# Patient Record
Sex: Female | Born: 1975 | Race: White | Hispanic: No | Marital: Single | Smoking: Former smoker
Health system: Southern US, Community
[De-identification: ages and names within clinical notes are randomized; demographics above are authoritative.]

---

## 2003-11-07 ENCOUNTER — Other Ambulatory Visit: Admission: RE | Admit: 2003-11-07 | Discharge: 2003-11-07 | Payer: Self-pay | Admitting: Obstetrics and Gynecology

## 2004-01-31 ENCOUNTER — Ambulatory Visit (HOSPITAL_COMMUNITY): Admission: RE | Admit: 2004-01-31 | Discharge: 2004-01-31 | Payer: Self-pay | Admitting: Obstetrics and Gynecology

## 2004-05-22 ENCOUNTER — Encounter: Admission: RE | Admit: 2004-05-22 | Discharge: 2004-05-22 | Payer: Self-pay | Admitting: Obstetrics and Gynecology

## 2004-05-24 ENCOUNTER — Inpatient Hospital Stay (HOSPITAL_COMMUNITY): Admission: AD | Admit: 2004-05-24 | Discharge: 2004-05-26 | Payer: Self-pay | Admitting: Obstetrics and Gynecology

## 2004-05-29 ENCOUNTER — Encounter: Admission: RE | Admit: 2004-05-29 | Discharge: 2004-05-29 | Payer: Self-pay | Admitting: Obstetrics and Gynecology

## 2004-05-31 ENCOUNTER — Inpatient Hospital Stay (HOSPITAL_COMMUNITY): Admission: AD | Admit: 2004-05-31 | Discharge: 2004-06-04 | Payer: Self-pay | Admitting: Obstetrics and Gynecology

## 2004-06-01 ENCOUNTER — Encounter (INDEPENDENT_AMBULATORY_CARE_PROVIDER_SITE_OTHER): Payer: Self-pay | Admitting: Specialist

## 2004-07-16 ENCOUNTER — Other Ambulatory Visit: Admission: RE | Admit: 2004-07-16 | Discharge: 2004-07-16 | Payer: Self-pay | Admitting: Obstetrics and Gynecology

## 2006-03-04 IMAGING — US US OB UMBILICAL ART DOPPLER
1 series · 13 of 18 positions shown · non-contrast
Comparison: none

CLINICAL DATA: Assess growth.  Pregnancy-induced hypertension.

[Series 1: unknown · 0.24mm/px · 13 of 18 slices shown]
[im 1/18]
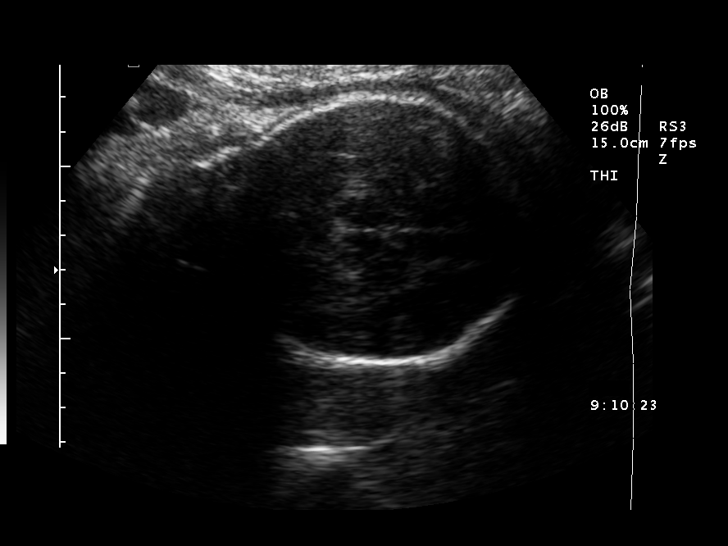
[im 3/18]
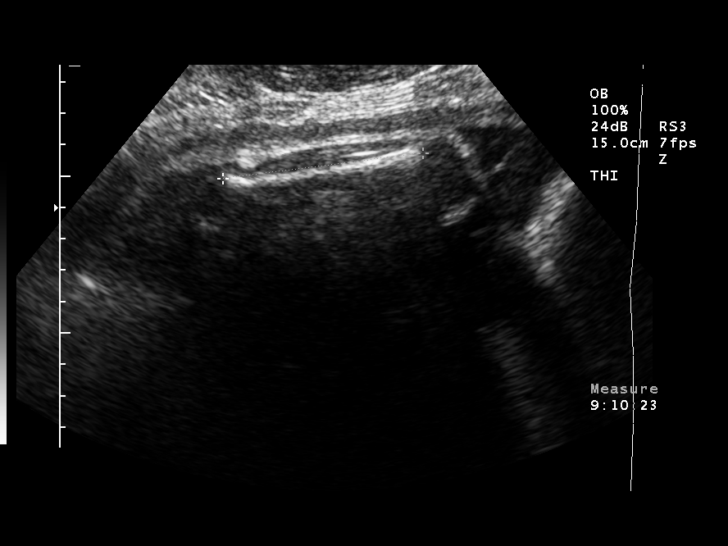
[im 4/18]
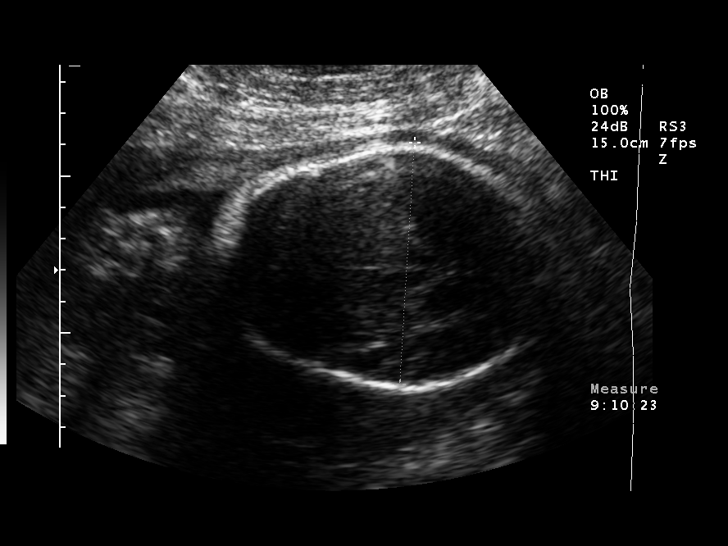
[im 5/18]
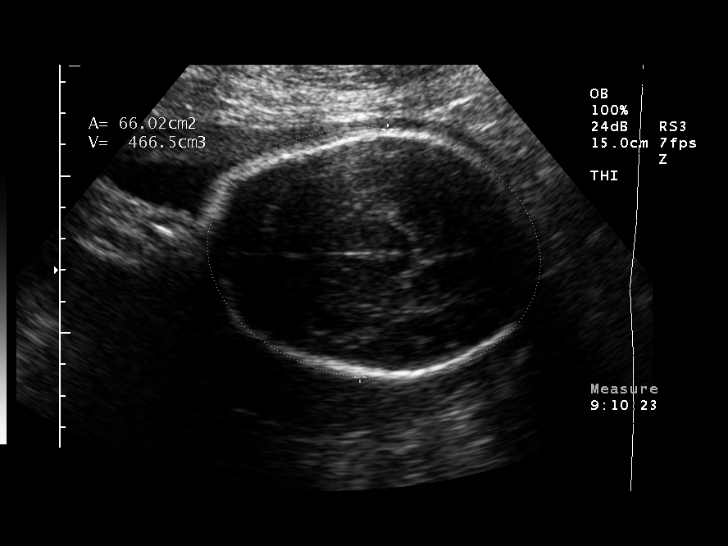
[im 7/18]
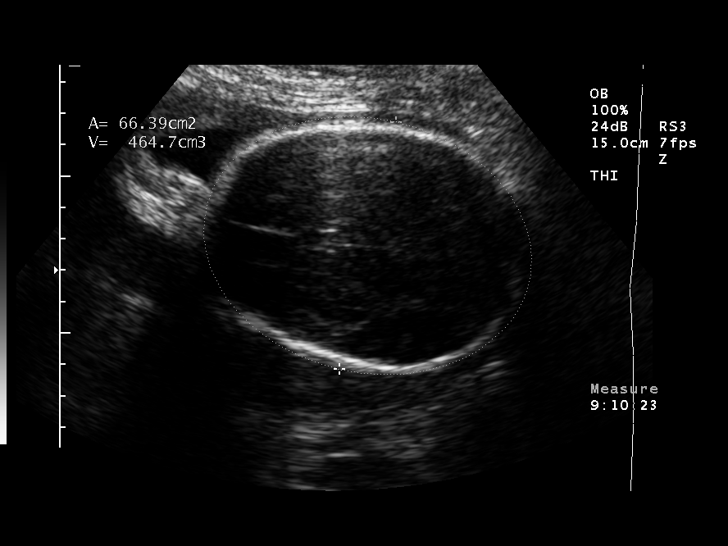
[im 8/18]
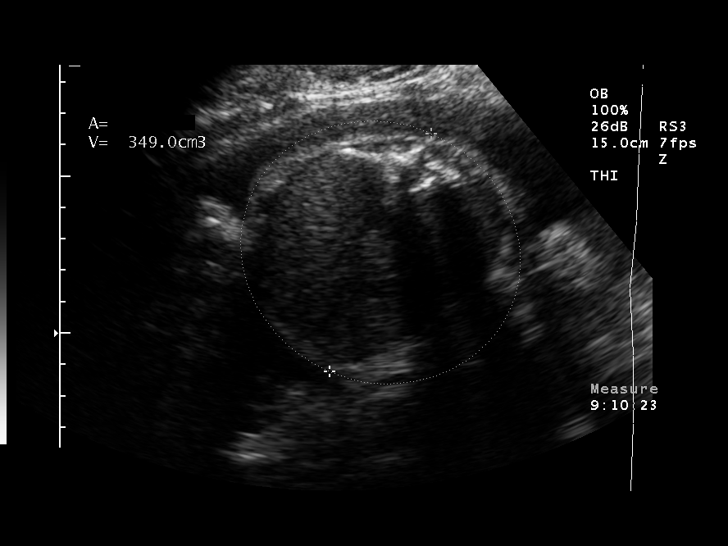
[im 10/18]
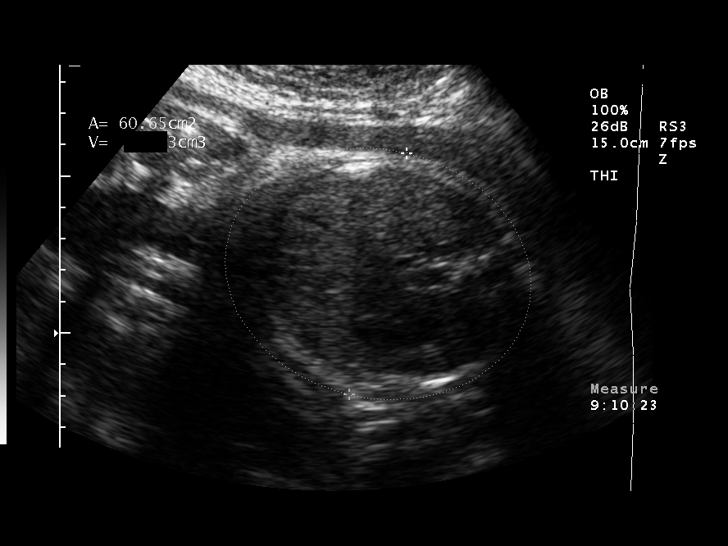
[im 11/18]
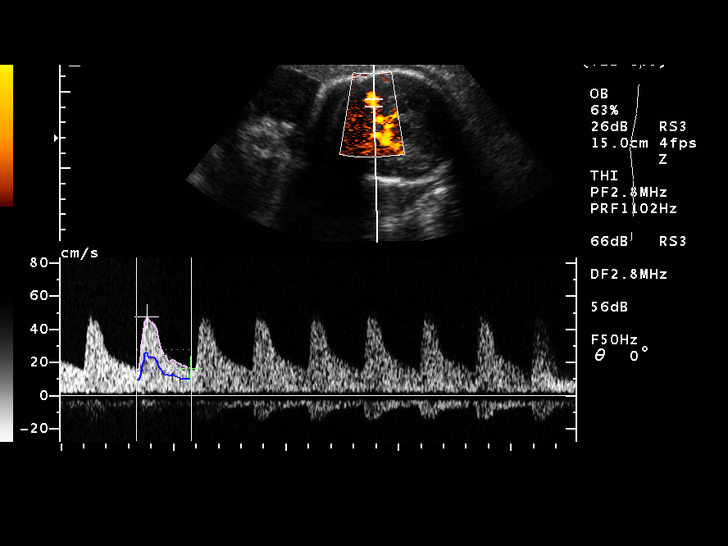
[im 12/18]
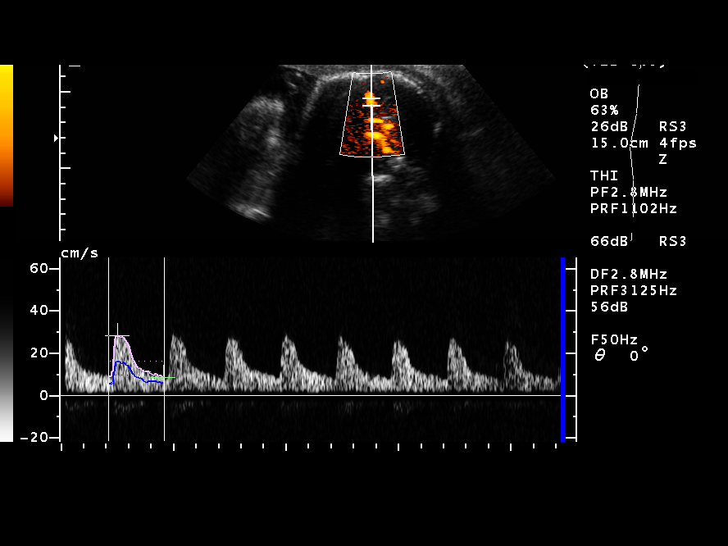
[im 14/18]
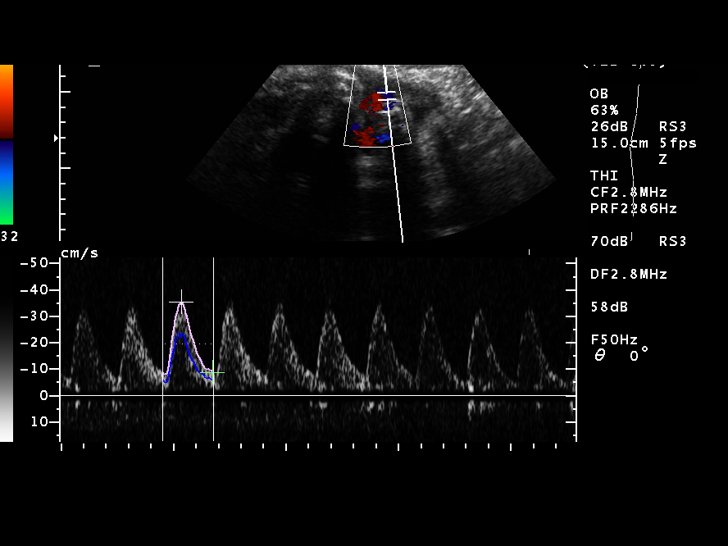
[im 15/18]
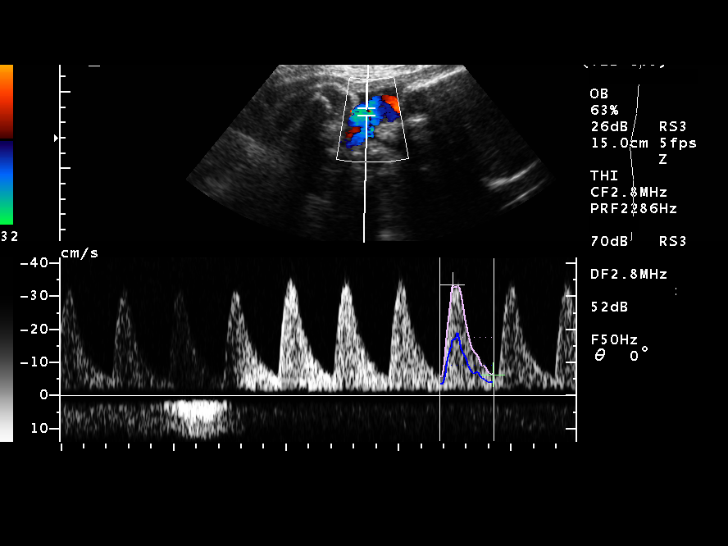
[im 16/18]
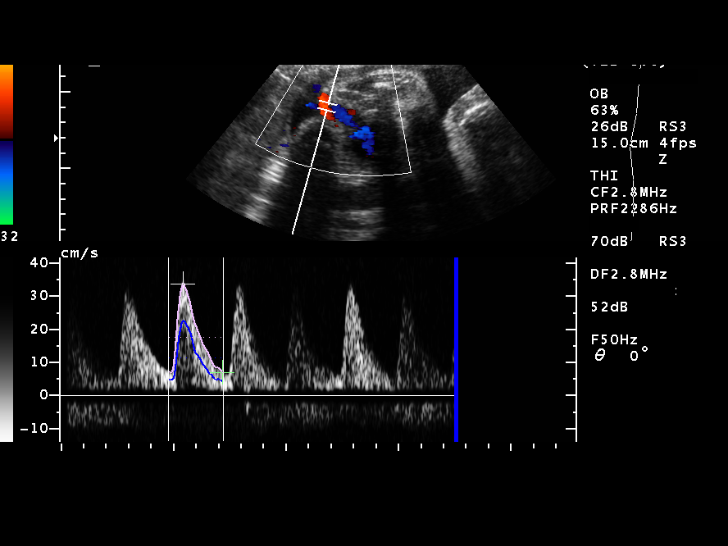
[im 18/18]
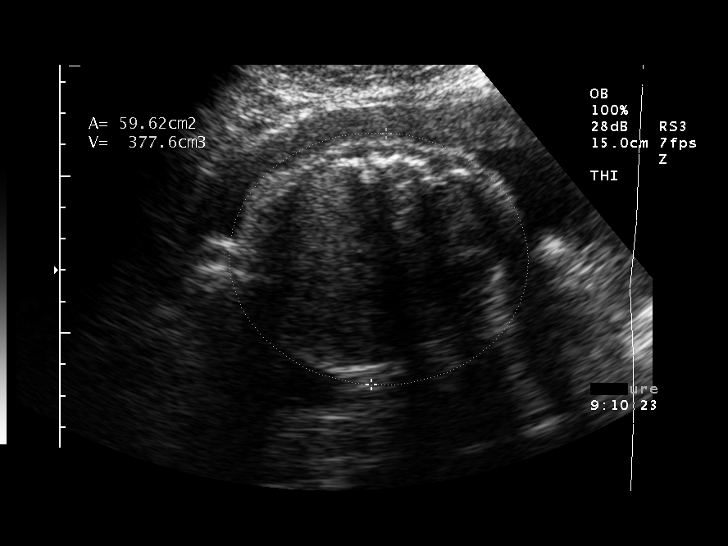

[13 of 18 positions shown; findings below may reference images not displayed]

OBSTETRICAL ULTRASOUND RE-EVALUATION WITH FETAL DOPPLER EXAM, 05/24/04
Number of Fetuses:  1
Heart Rate:  137 BPM
Movement:  Yes
Breathing:  No
Presentation:  Vertex
Placental Location:  Posterior
Grade:  I
Previa:  No
Amniotic Fluid (subjective):  Normal

FETAL BIOMETRY
BPD:  7.7 cm, 31 W 0 D
HC:  29.4 cm, 32 W 4 D
AC:  27.7 cm, 31 W 6 D
FL:  6.5 cm, 33 W 4 D

Mean GA:  32 W 2 D
Assigned GA:  35 W 3 D

EFW:  8063 grams (H) 63th-58th %ile (8086 ? 6865 g) for 35 weeks 
BPD/OFD  .75 (0.70 ? 0.86)
FL/BPD  .85 (0.71 ? 0.87)
FL/AC  .24 (0.20 - 0.24)    
HC/AC  1.06 (0.93 ? 1.11)
DOPPLER ULTRASOUND OF FETUS

Umbilical Artery S/D Ratio: 4.95 (NL< 3.45)

Middle Cerebral Artery PI:  1.13 (NL> 1.29)
IMPRESSION: Single living intrauterine pregnancy demonstrating an estimated gestational age by ultrasound of 32 weeks and 2 days.  This is 3 weeks and 1 day behind assigned gestational age by initial ultrasound of 35 weeks and 3 days.  Currently, the estimated fetal weight is just above the 10th percentile for a 35 week gestation and is concerning for developing symmetric intrauterine growth restriction or a small for gestational age fetus.
Subjectively and quantitatively normal amniotic fluid volume.
Elevated umbilical artery systolic/diastolic ratio and a decreased middle cerebral artery pulsatility index is noted.  No absence or reversal of end diastolic flow is seen on any of the interrogated strips.

## 2006-03-04 IMAGING — US US FETAL BPP W/O NONSTRESS
1 series · 14 of 27 positions shown · non-contrast
Comparison: none

CLINICAL DATA: Assess fetal well-being.

[Series 1: unknown · 0.30mm/px · 14 of 27 slices shown]
[im 1/27]
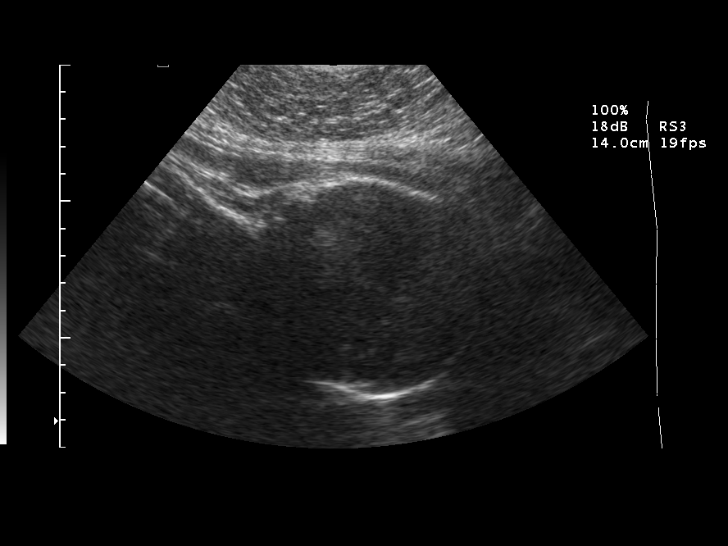
[im 3/27]
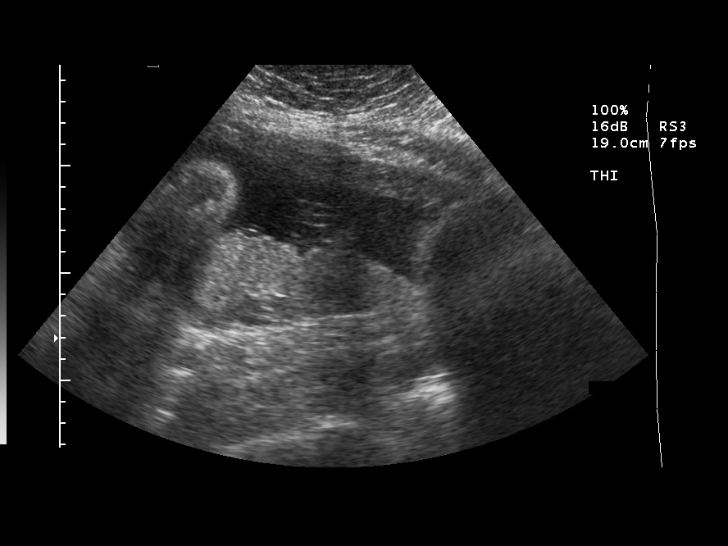
[im 5/27]
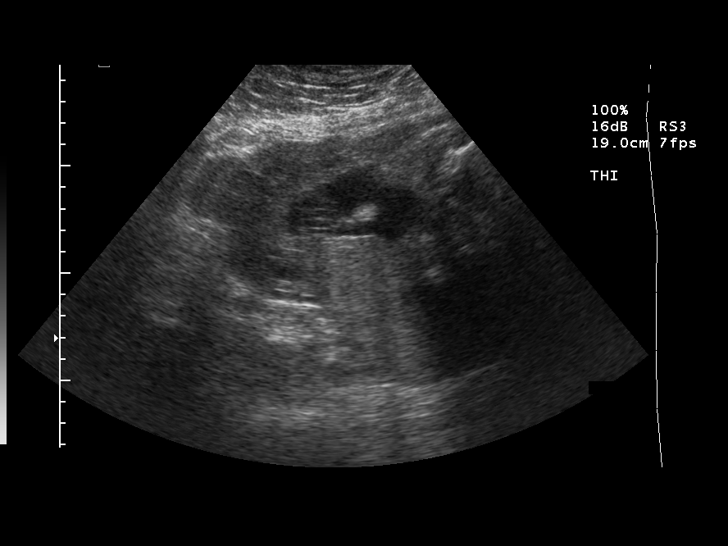
[im 7/27]
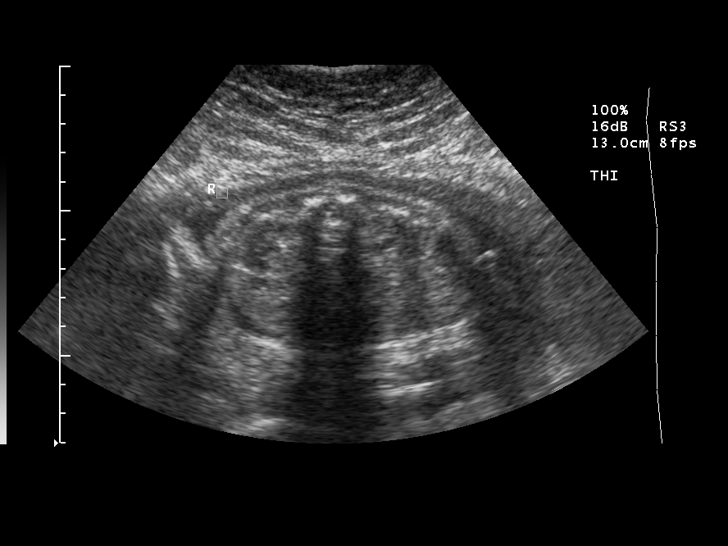
[im 9/27]
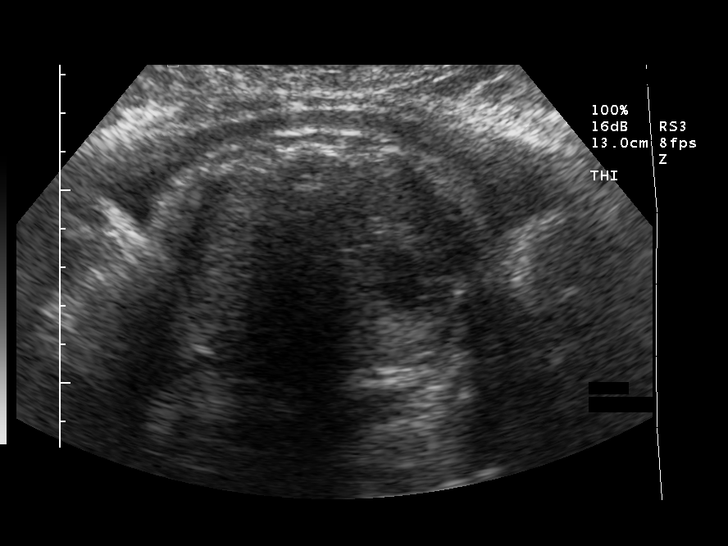
[im 11/27]
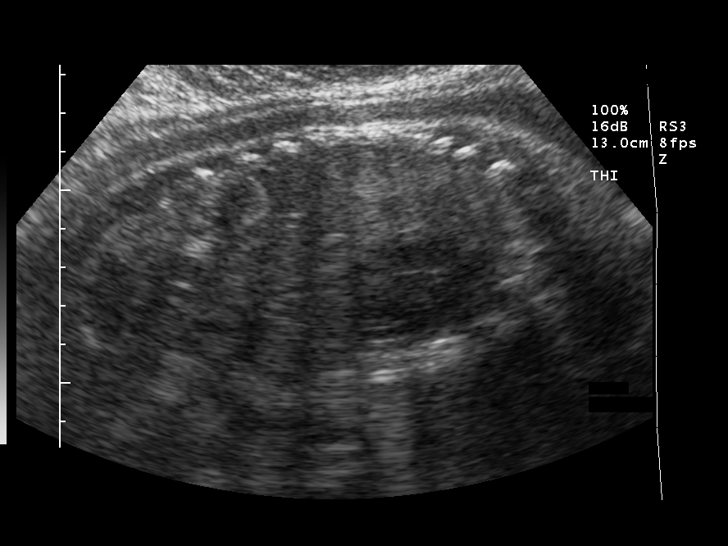
[im 13/27]
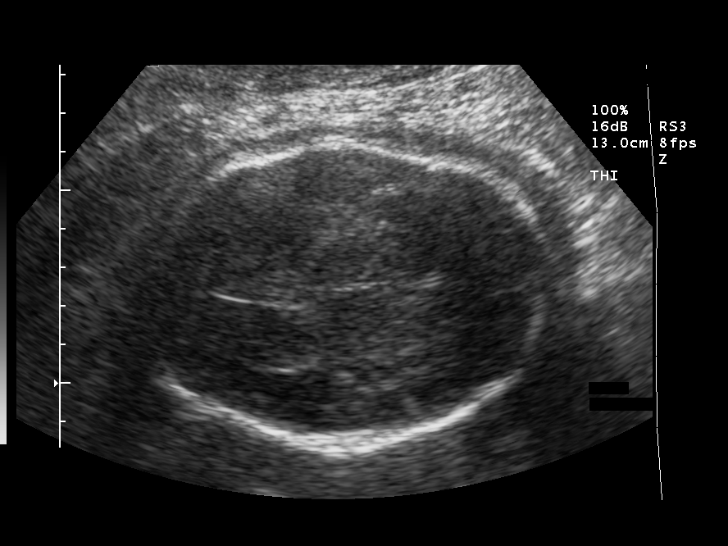
[im 15/27]
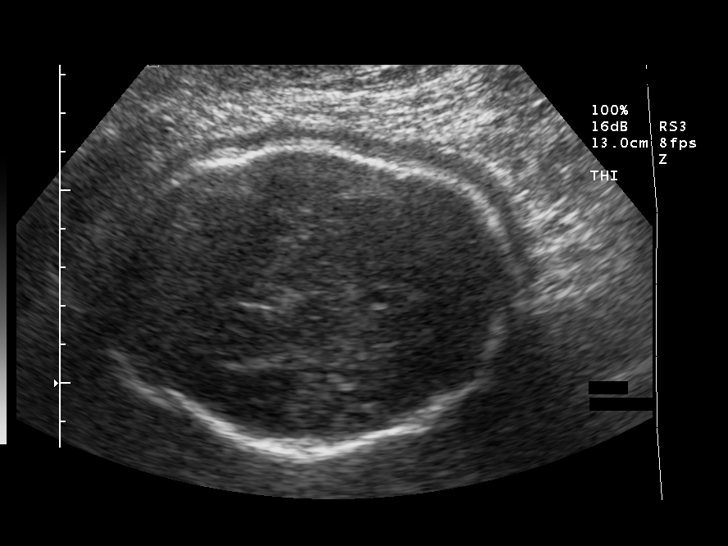
[im 17/27]
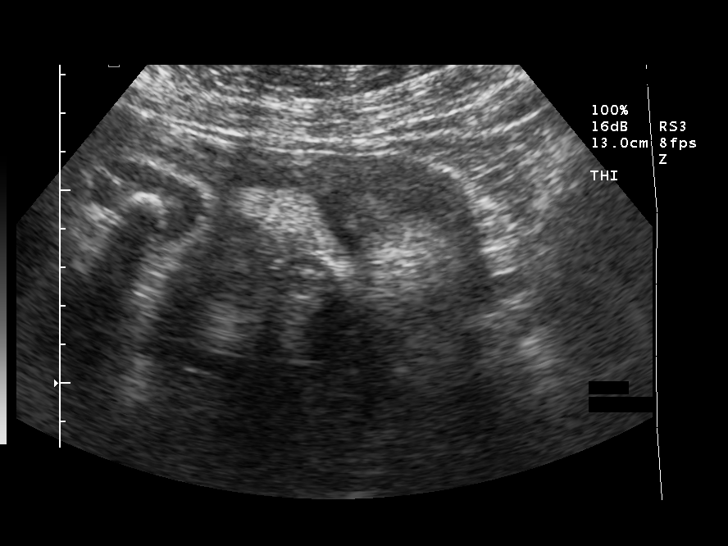
[im 19/27]
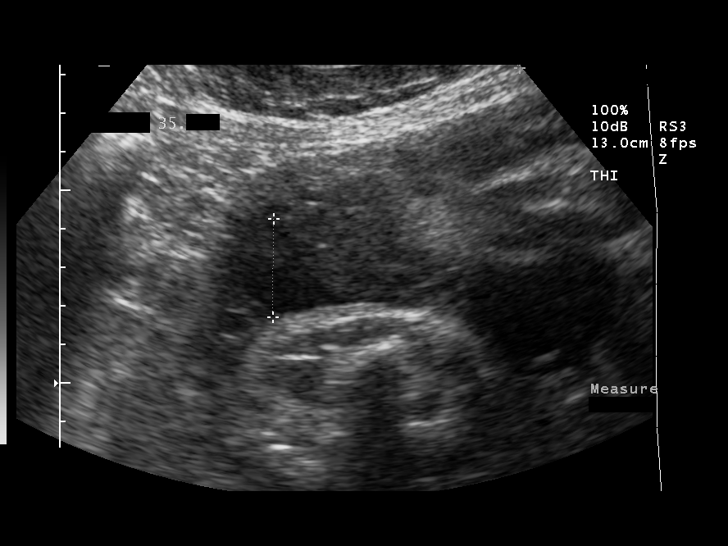
[im 21/27]
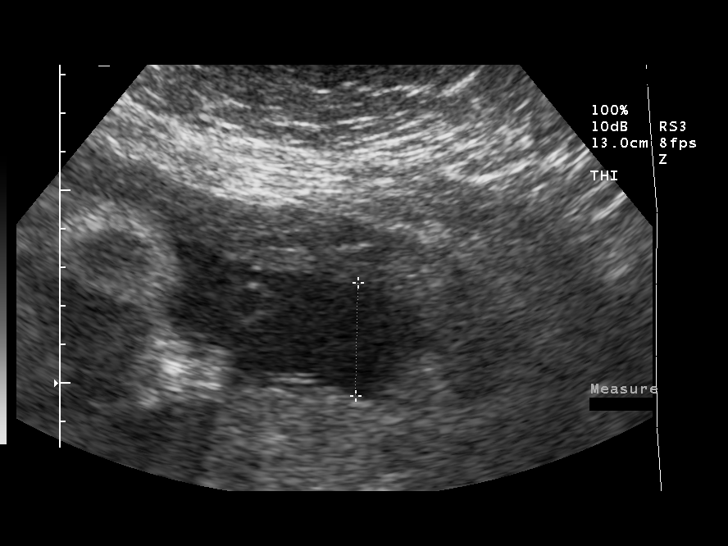
[im 23/27]
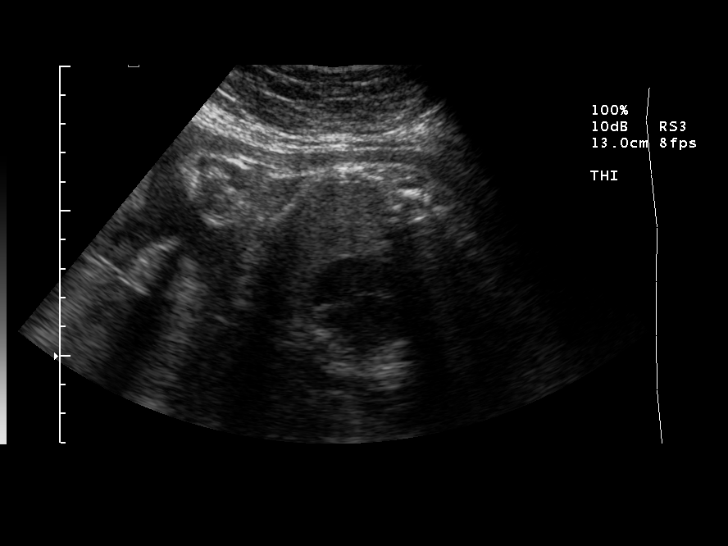
[im 25/27]
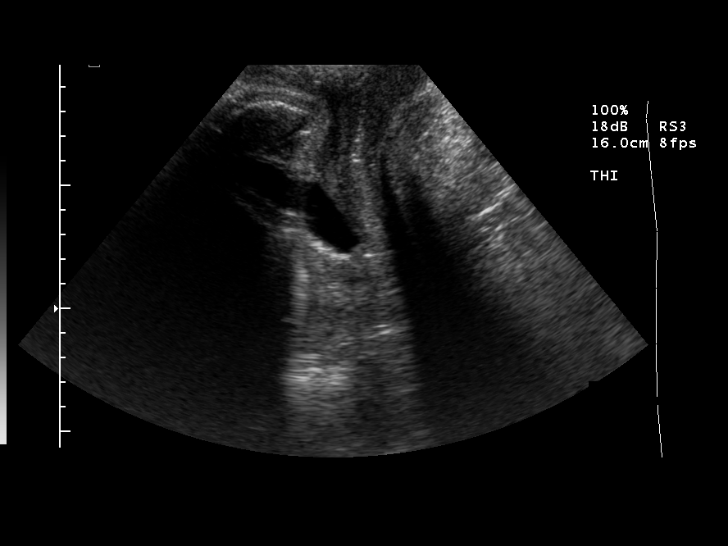
[im 27/27]
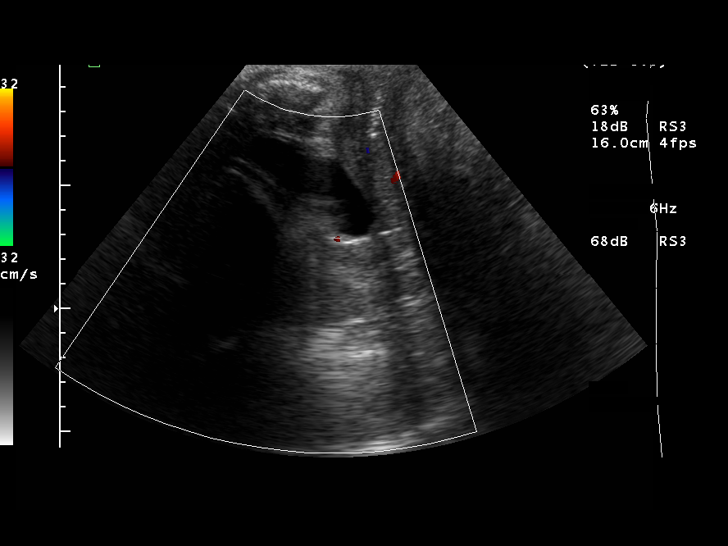

[14 of 27 positions shown; findings below may reference images not displayed]

BIOPHYSICAL PROFILE

Number of Fetuses:  1
Heart rate:  133 BPM
Movement:  Yes
Breathing:  Yes
Presentation:  Cephalic
Placental Location:  Posterior
Grade:  I
Previa:  No
Amniotic Fluid (Subjective):  Normal
Amniotic Fluid (Objective):  AFI 12.7 cm (4th-64th %ile =  7.9 to 24.9 cm for 35 weeks) 

Fetal measurements and complete anatomic evaluation were not requested.  The following fetal anatomy was visualized on this exam:  Lateral ventricles, thalami, stomach, kidneys, bladder, and diaphragm.

BPP SCORING
Movements:  2  Time:  15 minutes
Breathing:  2
Tone:  2
Amniotic Fluid:  2
Total Score:  8

MATERNAL FINDINGS:
Cervix:  3.6 cm translabially.
IMPRESSION: Biophysical profile score [DATE].  
Subjectively and quantitatively normal amniotic fluid and normal cervical length.
Interval resolution of prior placenta previa.
No late developing fetal anatomic abnormalities are identified associated with the lateral ventricles, stomach, kidneys, or bladder.  A four-chamber heart view could not be well assessed due to fetal positioning combined with maternal habitus.

## 2011-03-08 NOTE — Discharge Summary (Signed)
Valerie Kirby, FERRAN                  ACCOUNT NO.:  [id number]   MEDICAL RECORD NO.:  [id number]          PATIENT TYPE:  INP   LOCATION:  9147                          FACILITY:  [facility]   PHYSICIAN:  James A. Ashley Royalty, M.D.DATE OF BIRTH:  Sep 15, 1976   DATE OF ADMISSION:  05/31/2004  DATE OF DISCHARGE:  06/04/2004                                 DISCHARGE SUMMARY   DISCHARGE DIAGNOSES:  1.  Intrauterine pregnancy at 36 weeks, 3 day gestation.  2.  Pregnancy-induced hypertension.  3.  Intrauterine growth restriction (borderline).  4.  Oligohydramnios.  5.  Fetal heart changes consistent with diminished uteroplacental reserve.  6.  Desire for attempt at permanent surgical sterilization.   OPERATIONS/SPECIAL PROCEDURES:  1.  Primary low transverse cesarean section.  2.  Bilateral tubal sterilization procedure (modified Pomeroy).   CONSULTATIONS:  None.   DISCHARGE MEDICATIONS:  Percocet.   HISTORY AND PHYSICAL:  This is a 35 year old gravida 2, para 1, EDC  June 25, 2004, 36 weeks, 3 days gestation.  Prenatal care was  complicated by smoking, transient placenta previa (resolved as of 05/03/04)  and recent diagnosis of intrauterine growth restriction noted during recent  hospitalization for blood pressure elevation one week prior to current  admission.  Since admission, the patient has been maintained on aggressive  fetal surveillance.  Biophysical profile on the day of admission revealed a  score of 8 over a possible 8; however, the amniotic fluid index was noted to  be 7.7, sixth percentile for 36 weeks.  The patient was hence admitted for  further evaluation and therapy and consideration for delivery.  For the  remainder of the history and physical, please see chart.   HOSPITAL COURSE:  The patient was admitted to [facility] of  [place].  Admission laboratory studies were drawn.  Observation of the  fetal heart rate in the hospital revealed a generally reassuring  pattern  with occasional decelerations in association with contractions which  resembled late decelerations.  As the cervix continued to be quite  unfavorable, the determination was made that the patient probably  demonstrated diminished uteroplacental reserve and would thus be managed by  proceeding with cesarean section, hence was taken to the operating room  06/01/04 and underwent primary low transverse cesarean section as well as  bilateral tubal sterile sterilization procedure (modified Pomeroy).  The  procedure was uncomplicated.  It yield a 3 pound, 15 ounce female, Apgars of 8  at one minute, 9 at five minutes, sent to the newborn nursery.  Arterial  cord pH was 7.21.  Postoperative course was benign.  She was discharged home  on 06/04/04, afebrile and in satisfactory condition.   ACCESSORY CLINICAL FINDINGS:  Hemoglobin and hematocrit on admission were  13.4 and 40.1 respectively.  Additional values were obtained during the  hospitalization the last of which was June 03, 2004 which revealed values  of 11.2 and 33.2 respectively.  PIH panel was essentially benign.  Type and  Rh revealed a B positive blood.   DISPOSITION:  The patient is to return to [facility] and  Obstetrics  in 4-6 weeks for postpartum evaluation.  In addition, she was to return in  approximately 2 day to check blood pressure as well.      JAM/MEDQ  D:  07/18/2004  T:  07/18/2004  Job:  [id number]

## 2011-03-08 NOTE — H&P (Signed)
Valerie Kirby, Valerie Kirby                            ACCOUNT NO.:  [id number]   MEDICAL RECORD NO.:  [id number]                   PATIENT TYPE:  OUT   LOCATION:  ULT                                  [facility]   PHYSICIAN:  James A. Ashley Royalty, M.D.             DATE OF BIRTH:  02/26/1976   DATE OF ADMISSION:  05/24/2004  DATE OF DISCHARGE:                                HISTORY & PHYSICAL   This is a 35 year old, gravida 2, para 1, LMP September 05, 2003, [facility]  June 25, 2004.  Prenatal care has been complicated by a transient  placenta previa which was documented as resolved May 03, 2004.  The patient  also is a smoker albeit only modestly.  She presented to the office today at  which time she had a blood pressure of approximately 134/100.  She denies  any epigastric or right upper quadrant pain, headaches, or visual  disturbances.  She is admitted for evaluation and treatment of hypertension  in the context of pregnancy.   MEDICATIONS:  Vitamins.   PAST MEDICAL HISTORY:  Medical negative. Surgical negative. Allergies  PENICILLIN.   SOCIAL HISTORY:  The patient is a smoker, albeit modestly, denies use of  alcohol.   REVIEW OF SYMPTOMS:  Noncontributory.   PHYSICAL EXAMINATION:  GENERAL:  Well-developed, well-nourished, pleasant  white female in no acute distress.  VITAL SIGNS:  Afebrile, vital signs stable.  SKIN:  Warm and dry without lesions.  LYMPH:  There is supraclavicular, cervical or inguinal adenopathy.  HEENT:  Normocephalic, atraumatic.  NECK:  Supple without thyromegaly.  CHEST:  Lungs are clear.  CARDIAC:  Regular rate and rhythm without murmurs, rubs or gallops.  BREASTS:  Deferred.  ABDOMEN:  Gravid with a fundal height of approximately 35 cm.  Fetal heart  tones were auscultated with the Doppler.  MUSCULOSKELETAL:  Reveals full range of motion with 2+ edema dependently.  DTR's are 1 to 2+ and equal bilaterally.  PELVIC:  External genitalia within normal  limits.  Bimanual examination  reveals the cervix to be closed, soft and presenting part high.   IMPRESSION:  1. Intrauterine pregnancy at 35 weeks and 3 days gestation.  2. Hypertension--probable pregnancy induced. Mild.  3. Smoker.   PLAN:  Admit. Check labs including ultrasound, obtain 24 hour urine.  Will  assess blood pressures frequently and treat accordingly.                                               James A. Ashley Royalty, M.D.    JAM/MEDQ  D:  05/24/2004  T:  05/24/2004  Job:  [id number]

## 2011-03-08 NOTE — Op Note (Signed)
Valerie Kirby, Valerie Kirby                            ACCOUNT NO.:  [id number]   MEDICAL RECORD NO.:  [id number]                   PATIENT TYPE:  OUT   LOCATION:  ULT                                  [facility]   PHYSICIAN:  James A. Ashley Royalty, M.D.             DATE OF BIRTH:  1975/11/09   DATE OF PROCEDURE:  06/01/2004  DATE OF DISCHARGE:                                 OPERATIVE REPORT   PREOPERATIVE DIAGNOSES:  1. Intrauterine pregnancy at [redacted] weeks gestation.  2. Preeclampsia.  3. Intrauterine growth restriction.  4. Oligohydramnios.  5. Fetal heart rate changes consistent with diminished uteroplacental     reserve.  6. Desire for attempted permanent surgical sterilization.   POSTOPERATIVE DIAGNOSES:  1. Intrauterine pregnancy at [redacted] weeks gestation.  2. Preeclampsia.  3. Intrauterine growth restriction.  4. Oligohydramnios.  5. Fetal heart rate changes consistent with diminished uteroplacental     reserve.  6. Desire for attempted permanent surgical sterilization.   PATHOLOGY:  Pending.   PROCEDURE:  1. Primary low transverse cesarean section.  2. Bilateral tubal sterilization procedure (modified Pomeroy).   SURGEON:  Rudy Jew. Ashley Royalty, M.D.   ASSISTANT:  Bing Neighbors. Delcambre, MD   ANESTHESIA:  Spinal.   FINDINGS:  3 pound 15 ounce female, Apgar's 8 at 1 minute, 9 at 5 minutes sent  to the newborn nursery. Arterial cord pH was 7.21.   ESTIMATED BLOOD LOSS:  800 mL.   COMPLICATIONS:  None.   PACKS/DRAINS:  Foley.   COUNTS:  Sponge, needle and instrument counts were reported as correct x2.   DESCRIPTION OF PROCEDURE:  The patient was taken to the operating room and  placed in the sitting position. After a spinal anesthetic was administered,  she was placed in the dorsal supine position and prepped and draped in the  usual manner for abdominal surgery. A Foley catheter was placed. A  Pfannenstiel incision was made down to the level of the fascia. The fascia  was  nicked with a knife and incised transversely with Mayo scissors. The  underlying rectus muscles were separated from overlying fascia using sharp  and blunt dissection.  The peritoneum was elevated with hemostats and  entered atraumatically with Metzenbaum scissors. The incision was extended  longitudinally. The uterus was identified and a bladder flap created by  incising the anterior uterine serosa and sharply and bluntly dissecting the  bladder inferiorly. It was held in place with a bladder blade.  The uterus  was then entered through a low transverse incision using sharp and blunt  dissection. Amniotomy was performed. There was only a scant amount of fluid  present.  It was clear. The infant was then delivered from the vertex  presentation in an atraumatic manner. The infant was suctioned. The cord was  strictly clamped, cut and the infant given immediately to the awaiting  pediatric's team. Arterial cord pH was obtained  from an isolated segment.  Next regular cord blood was obtained.  The placenta and membranes were  removed in their entirety and submitted to pathology for histologic studies.  The uterus was then exteriorized. The uterus was closed in two running  layers with #1 Vicryl. The first was a running locking layer. The second was  a running, intermittently locking, and imbricating layer. Hemostasis was  noted.   Attention was then turned to the tubal sterilization procedure. The left  lobe and tube was grasped with a Babcock clamp and traced to its fimbriated  end. An avascular area in the distal isthmic to proximal ampullary portion  was chosen for ligation.  Two #1 plain catgut sutures were used as  ligatures, tied securely around this portion of the tube. The intervening  portion of fallopian tube was incised with Metzenbaum scissors and submitted  to pathology for histologic studies. Hemostasis was noted. Next, the right  fallopian tube was grasped with a Babcock clamp  and traced to its fimbriated  end. An avascular area in the distal isthmic to proximal ampullary portion  was chosen for ligation. Two #1 plain catgut sutures were securely tied  around this knuckle of the tube. The intervening portion of the tube was  excised with Metzenbaum scissors and submitted to pathology for histologic  studies. Hemostasis was noted. The uterus, tubes and ovaries were inspected  and found to be otherwise normal. They were returned to the abdominal  cavity. Hemostasis was noted. Copious irrigation was accomplished.  Hemostasis was once again noted. The peritoneum was then closed with 3-0  Vicryl in a running fashion. The fascia was closed with #0 Vicryl in a  running fashion. The skin was closed with staples.   The patient tolerated the procedure extremely well and was returned to the  recovery room in good condition.                                               James A. Ashley Royalty, M.D.    JAM/MEDQ  D:  06/01/2004  T:  06/02/2004  Job:  [id number]

## 2011-03-08 NOTE — Discharge Summary (Signed)
Valerie Kirby, HIEBERT                            ACCOUNT NO.:  [id number]   MEDICAL RECORD NO.:  [id number]                   PATIENT TYPE:  INP   LOCATION:  9159                                 [facility]   PHYSICIAN:  James A. Ashley Royalty, M.D.             DATE OF BIRTH:  August 30, 1976   DATE OF ADMISSION:  05/24/2004  DATE OF DISCHARGE:  05/26/2004                                 DISCHARGE SUMMARY   DISCHARGE DIAGNOSES:  1. Intrauterine pregnancy at 35 weeks 5 days' gestation.  2. Pregnancy induced hypertension, mild.  3. Smoker.  4. Borderline intrauterine growth retardation.   DISCHARGE MEDICATIONS:  Vitamins.   OPERATIONS:  None.   CONSULTATIONS:  None.   HISTORY OF PRESENT ILLNESS:  This is a 35 year old gravida 2, para 1, EDC  June 25, 2004.  The pregnancy has been complicated by a transient  placenta previa which resolved.  The patient is also a smoker albeit only  modestly.  She presented to the office May 24, 2004, and was noted to have  a blood pressure of approximately 134/100.  She was admitted for  observation, 24 hour urine and fetal surveillance.  For the remainder of the  history and physical, please see the chart.   HOSPITAL COURSE:  The patient was admitted to [facility] of  [place].  The admission laboratory studies were drawn.  The [facility] panel was  satisfactory.  The ultrasound revealed a single vertex presentation.  Estimated fetal weight was approximately 1850 g which was in the vicinity of  the 10th percentile.  Fluid was normal.  The 24 hour urine was completed on  May 26, 2004, and revealed a protein of 219 mg per 24 hours.  Creatinine  clearance was 133.  The fetal heart tracing was reactive.   I told the patient that I am interested in delivering her at approximately  36 weeks unless she has any further deterioration due to the aforementioned  risk factors.  Tentative plan is to ripen her cervix with the Cytotec on  May 28, 2004, and  hopefully induce labor on May 29, 2004.  In the  interim, the patient and husband strongly stated a desire to be discharged  home.  I told him that would be my preference to stay in the hospital.  However, in deference to their request and agreement to maintain modified  bed rest, I agreed to let her go home until May 28, 2004, at which time  she will be a direct admit for the aforementioned plan.                                               James A. Ashley Royalty, M.D.    JAM/MEDQ  D:  05/26/2004  T:  05/28/2004  Job:  [id number]

## 2011-03-08 NOTE — H&P (Signed)
Valerie Kirby, Valerie Kirby                            ACCOUNT NO.:  [id number]   MEDICAL RECORD NO.:  [id number]                   PATIENT TYPE:  INP   LOCATION:  9166                                 [facility]   PHYSICIAN:  James A. Ashley Royalty, M.D.             DATE OF BIRTH:  1976-07-26   DATE OF ADMISSION:  05/31/2004  DATE OF DISCHARGE:                                HISTORY & PHYSICAL   A 35 year old, gravida 2, para 1, EDC June 25, 2004 currently 36 weeks 3  days gestation. Prenatal care has been complicated by smoking, a transient  placenta previa (resolved May 03, 2004), and most recent a diagnosis of  intrauterine growth restriction noted during recent hospitalization for  blood pressure elevation approximately one week ago.  The patient was  hospitalized approximately one week ago for blood pressure elevation.  A 24  hour urine was reassuring.  Labs were similarly reassuring and blood  pressure responded appropriately. The patient underwent an ultrasound which  revealed borderline intrauterine growth restriction.  She has been  maintained with aggressive fetal surveillance _________ including a  nonstress test May 29, 2004 (reactive) and a biophysical profile today  which revealed a score of 8/8.  However, the amniotic fluid index was 7.7, 6  percentile for 36 weeks.   MEDICATIONS:  None.   PAST MEDICAL HISTORY:  Medical negative. Surgical negative. Allergies  PENICILLIN.   FAMILY HISTORY:  Noncontributory.   SOCIAL HISTORY:  The patient is a smoker without significant use of alcohol.   REVIEW OF SYMPTOMS:  Noncontributory.   PHYSICAL EXAMINATION:  GENERAL:  Well-developed, well-nourished, pleasant,  obese white female in no acute distress.  VITAL SIGNS:  Afebrile, vital signs stable. Blood pressure done today in the  office 120/82.  SKIN:  Warm and dry without lesions.  LYMPH NODES:  No supraclavicular, cervical or inguinal adenopathy.  HEENT:  Normocephalic.  NECK:  Supple without thyromegaly.  CHEST:  Lungs are clear.  CARDIAC:  Regular rate and rhythm without murmur, gallop or rub.  BREASTS:  Deferred.  ABDOMEN:  Soft and nontender without masses or organomegaly.  Bowel sounds  are active.  MUSCULOSKELETAL:  Reveals full range of motion without edema, cyanosis or  CVA tenderness.  PELVIC:  Sterile vaginal examination reveals the cervix to be closed, thick  and high.   IMPRESSION:  1. Intrauterine pregnancy at 36 weeks and 3 days gestation.  2. Pregnancy induced hypertension.  3. Borderline intrauterine growth restriction.  4. Oligohydramnios.   PLAN:  The patient is admitted for observation and consideration for  delivery. Considering the unfavorable cervix, she will be considered for  Cytotec if appropriate with subsequent induction secondary to the above risk  factors.  James A. Ashley Royalty, M.D.    JAM/MEDQ  D:  05/31/2004  T:  05/31/2004  Job:  [id number]

## 2020-08-17 ENCOUNTER — Ambulatory Visit: Payer: BC Managed Care – PPO | Admitting: Orthopaedic Surgery

## 2020-08-17 ENCOUNTER — Ambulatory Visit: Payer: Self-pay

## 2020-08-17 ENCOUNTER — Encounter: Payer: Self-pay | Admitting: Orthopaedic Surgery

## 2020-08-17 VITALS — Ht 65.0 in | Wt 260.8 lb

## 2020-08-17 DIAGNOSIS — M25561 Pain in right knee: Secondary | ICD-10-CM

## 2020-08-17 DIAGNOSIS — G8929 Other chronic pain: Secondary | ICD-10-CM

## 2020-08-17 MED ORDER — METHYLPREDNISOLONE ACETATE 40 MG/ML IJ SUSP
40.0000 mg | INTRAMUSCULAR | Status: AC | PRN
Start: 2020-08-17 — End: 2020-08-17
  Administered 2020-08-17: 40 mg via INTRA_ARTICULAR

## 2020-08-17 MED ORDER — LIDOCAINE HCL 1 % IJ SOLN
3.0000 mL | INTRAMUSCULAR | Status: AC | PRN
  Administered 2020-08-17: 3 mL

## 2020-08-17 NOTE — Progress Notes (Signed)
Office Visit Note   Patient: Valerie Kirby           Date of Birth: Jun 17, 1976           MRN: [id number] Visit Date: 08/17/2020              Requested by: No referring provider defined for this encounter. PCP: Pcp, No   Assessment & Plan: Visit Diagnoses:  1. Chronic pain of right knee     Plan: Based on her clinical exam and signs and symptoms combined with the plain films showing a questionable loose body in the knee, I am concerned about a loose body and a meniscal tear of the lateral meniscus.  At this point a MRI is warranted to rule out a meniscal tear and to assess for loose body of her right knee  I did place a steroid injection in her right knee today to help alleviate the acute pain and treat inflammation.  We will see her back after the MRI of her right knee.  All questions and concerns were answered and addressed.  Follow-Up Instructions: Return in about 2 years (around 08/17/2022).   Orders:  Orders Placed This Encounter  Procedures  . Large Joint Inj: R knee  . XR Knee 1-2 Views Right   No orders of the defined types were placed in this encounter.     Procedures: Large Joint Inj: R knee on 08/17/2020 4:28 PM Indications: diagnostic evaluation and pain Details: 22 G 1.5 in needle, superolateral approach  Arthrogram: No  Medications: 3 mL lidocaine 1 %; 40 mg methylPREDNISolone acetate 40 MG/ML Outcome: tolerated well, no immediate complications Procedure, treatment alternatives, risks and benefits explained, specific risks discussed. Consent was given by the patient. Immediately prior to procedure a time out was called to verify the correct patient, procedure, equipment, support staff and site/side marked as required. Patient was prepped and draped in the usual sterile fashion.       Clinical Data: No additional findings.   Subjective: Chief Complaint  Patient presents with  . Right Knee - Pain  Valerie Kirby is a very pleasant 44 year old female who comes  in today for evaluation treatment of right knee pain and instability symptoms.  She has a history of 2 arthroscopic interventions on the right knee with the first 1 done when she was only 44 years old and the second 1 done a few years later.  She has had several steroid injections over the years in that right knee.  She has been through physical therapy as well.  At one time it was recommended she have hyaluronic acid injection for the right knee to treat the chronic pain of her knee but this was denied by insurance.  She reports a several week history of locking and catching of the right knee and the knee giving out.  She points to the lateral joint line as a source of her pain and issues with that knee.  She denies any other acute changes in her medical status.  She is not a diabetic.  She is otherwise an active individual.  Her BMI is 43.  HPI  Review of Systems She currently denies any headache, chest pain, shortness of breath, fever, chills, nausea, vomiting  Objective: Vital Signs: Ht 5\' 5"  (1.651 m)   Wt 260 lb 12.8 oz (118.3 kg)   BMI 43.40 kg/m   Physical Exam She is alert and orient x3 and in no acute distress Ortho Exam Examination of her right  knee does show grinding at the patellofemoral joint with crepitation.  Her knee does hyperextend.  It feels ligamentously stable.  There is significant lateral joint line tenderness with a positive McMurray sign to the lateral compartment of her knee. Specialty Comments:  No specialty comments available.  Imaging: XR Knee 1-2 Views Right  Result Date: 08/17/2020 2 views of the right knee show no acute findings.  The medial lateral compartments are well-maintained.  There are some mild patellofemoral arthritic changes.  There is a questionable loose body in the anterior aspect of the knee seen on the lateral view.    PMFS History: There are no problems to display for this patient.  History reviewed. No pertinent past medical history.    History reviewed. No pertinent family history.  History reviewed. No pertinent surgical history. Social History   Occupational History  . Not on file  Tobacco Use  . Smoking status: Former Games developer  . Smokeless tobacco: Never Used  Substance and Sexual Activity  . Alcohol use: Not on file  . Drug use: Not on file  . Sexual activity: Not on file

## 2020-08-18 ENCOUNTER — Other Ambulatory Visit: Payer: Self-pay

## 2020-08-18 DIAGNOSIS — G8929 Other chronic pain: Secondary | ICD-10-CM

## 2020-08-31 ENCOUNTER — Ambulatory Visit: Payer: BC Managed Care – PPO | Admitting: Orthopaedic Surgery
